# Patient Record
Sex: Female | Born: 1985 | Race: Black or African American | Hispanic: No | Marital: Single | Smoking: Never smoker
Health system: Southern US, Community
[De-identification: ages and names within clinical notes are randomized; demographics above are authoritative.]

## PROBLEM LIST (undated history)

## (undated) DIAGNOSIS — E119 Type 2 diabetes mellitus without complications: Secondary | ICD-10-CM

---

## 2006-05-04 ENCOUNTER — Emergency Department (HOSPITAL_COMMUNITY): Admission: EM | Admit: 2006-05-04 | Discharge: 2006-05-04 | Payer: Self-pay | Admitting: Emergency Medicine

## 2006-06-23 ENCOUNTER — Emergency Department (HOSPITAL_COMMUNITY): Admission: EM | Admit: 2006-06-23 | Discharge: 2006-06-23 | Payer: Self-pay | Admitting: Emergency Medicine

## 2007-12-17 ENCOUNTER — Emergency Department (HOSPITAL_COMMUNITY): Admission: EM | Admit: 2007-12-17 | Discharge: 2007-12-17 | Payer: Self-pay | Admitting: Emergency Medicine

## 2007-12-29 ENCOUNTER — Ambulatory Visit (HOSPITAL_COMMUNITY): Admission: RE | Admit: 2007-12-29 | Discharge: 2007-12-29 | Payer: Self-pay | Admitting: Obstetrics & Gynecology

## 2008-01-12 ENCOUNTER — Ambulatory Visit: Payer: Self-pay | Admitting: Obstetrics & Gynecology

## 2008-01-13 ENCOUNTER — Ambulatory Visit: Payer: Self-pay | Admitting: Obstetrics & Gynecology

## 2008-01-19 ENCOUNTER — Ambulatory Visit: Payer: Self-pay | Admitting: Obstetrics & Gynecology

## 2008-01-19 ENCOUNTER — Encounter: Admission: RE | Admit: 2008-01-19 | Discharge: 2008-04-05 | Payer: Self-pay | Admitting: Obstetrics & Gynecology

## 2008-01-26 ENCOUNTER — Ambulatory Visit: Payer: Self-pay | Admitting: Obstetrics & Gynecology

## 2008-01-26 ENCOUNTER — Ambulatory Visit (HOSPITAL_COMMUNITY): Admission: RE | Admit: 2008-01-26 | Discharge: 2008-01-26 | Payer: Self-pay | Admitting: Family Medicine

## 2008-02-12 ENCOUNTER — Ambulatory Visit: Payer: Self-pay | Admitting: *Deleted

## 2008-02-17 ENCOUNTER — Ambulatory Visit: Payer: Self-pay | Admitting: Obstetrics & Gynecology

## 2008-02-19 ENCOUNTER — Ambulatory Visit: Payer: Self-pay | Admitting: Obstetrics & Gynecology

## 2008-02-19 ENCOUNTER — Ambulatory Visit (HOSPITAL_COMMUNITY): Admission: RE | Admit: 2008-02-19 | Discharge: 2008-02-19 | Payer: Self-pay | Admitting: Obstetrics & Gynecology

## 2008-02-23 ENCOUNTER — Ambulatory Visit: Payer: Self-pay | Admitting: Obstetrics & Gynecology

## 2008-03-01 ENCOUNTER — Ambulatory Visit: Payer: Self-pay | Admitting: Obstetrics & Gynecology

## 2008-03-04 ENCOUNTER — Ambulatory Visit: Payer: Self-pay | Admitting: Obstetrics and Gynecology

## 2008-03-08 ENCOUNTER — Ambulatory Visit: Payer: Self-pay | Admitting: *Deleted

## 2008-03-11 ENCOUNTER — Ambulatory Visit: Payer: Self-pay | Admitting: Obstetrics and Gynecology

## 2008-03-11 ENCOUNTER — Ambulatory Visit (HOSPITAL_COMMUNITY): Admission: RE | Admit: 2008-03-11 | Discharge: 2008-03-11 | Payer: Self-pay | Admitting: Family Medicine

## 2008-03-15 ENCOUNTER — Ambulatory Visit: Payer: Self-pay | Admitting: Obstetrics & Gynecology

## 2008-03-18 ENCOUNTER — Ambulatory Visit: Payer: Self-pay | Admitting: Obstetrics & Gynecology

## 2008-03-18 ENCOUNTER — Ambulatory Visit (HOSPITAL_COMMUNITY): Admission: RE | Admit: 2008-03-18 | Discharge: 2008-03-18 | Payer: Self-pay | Admitting: Family Medicine

## 2008-03-25 ENCOUNTER — Ambulatory Visit: Payer: Self-pay | Admitting: Obstetrics and Gynecology

## 2008-03-29 ENCOUNTER — Ambulatory Visit: Payer: Self-pay | Admitting: Family Medicine

## 2008-04-01 ENCOUNTER — Ambulatory Visit: Payer: Self-pay | Admitting: Obstetrics & Gynecology

## 2008-04-03 ENCOUNTER — Inpatient Hospital Stay (HOSPITAL_COMMUNITY): Admission: AD | Admit: 2008-04-03 | Discharge: 2008-04-03 | Payer: Self-pay | Admitting: Obstetrics & Gynecology

## 2008-04-08 ENCOUNTER — Ambulatory Visit (HOSPITAL_COMMUNITY): Admission: RE | Admit: 2008-04-08 | Discharge: 2008-04-08 | Payer: Self-pay | Admitting: Obstetrics & Gynecology

## 2008-04-08 ENCOUNTER — Ambulatory Visit: Payer: Self-pay | Admitting: *Deleted

## 2008-04-12 ENCOUNTER — Ambulatory Visit: Payer: Self-pay | Admitting: Family Medicine

## 2008-04-14 ENCOUNTER — Ambulatory Visit: Payer: Self-pay | Admitting: Obstetrics and Gynecology

## 2008-04-14 ENCOUNTER — Inpatient Hospital Stay (HOSPITAL_COMMUNITY): Admission: AD | Admit: 2008-04-14 | Discharge: 2008-04-14 | Payer: Self-pay | Admitting: Obstetrics & Gynecology

## 2008-04-15 ENCOUNTER — Ambulatory Visit: Payer: Self-pay | Admitting: Obstetrics & Gynecology

## 2008-04-16 ENCOUNTER — Ambulatory Visit: Payer: Self-pay | Admitting: Gynecology

## 2008-04-16 ENCOUNTER — Inpatient Hospital Stay (HOSPITAL_COMMUNITY): Admission: AD | Admit: 2008-04-16 | Discharge: 2008-04-17 | Payer: Self-pay | Admitting: Gynecology

## 2008-06-26 IMAGING — US US OB FOLLOW-UP
1 series · 14 of 26 positions shown · non-contrast
Comparison: none

OBSTETRICAL ULTRASOUND:
 This ultrasound exam was performed in the [HOSPITAL] Ultrasound Department.  The OB US report was generated in the AS system, and faxed to the ordering physician.  This report is also available in [REDACTED] PACS.

[Series 1: us ob follow-up · 0.28mm/px · 14 of 26 slices shown]
[im 1/26]
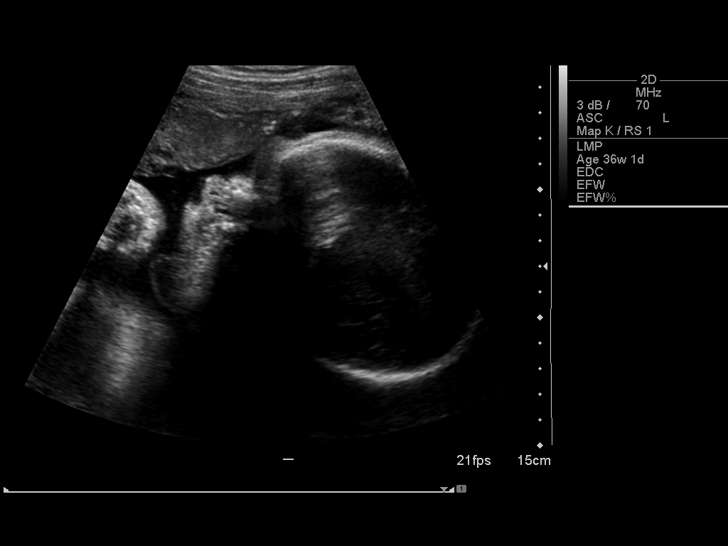
[im 3/26]
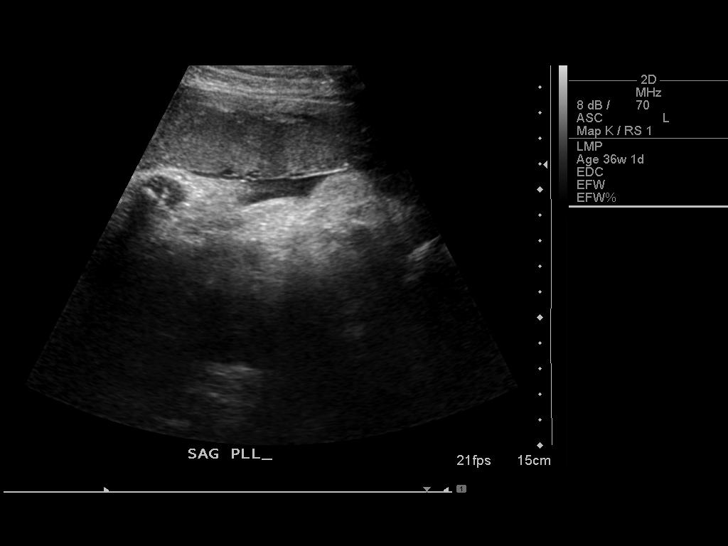
[im 5/26]
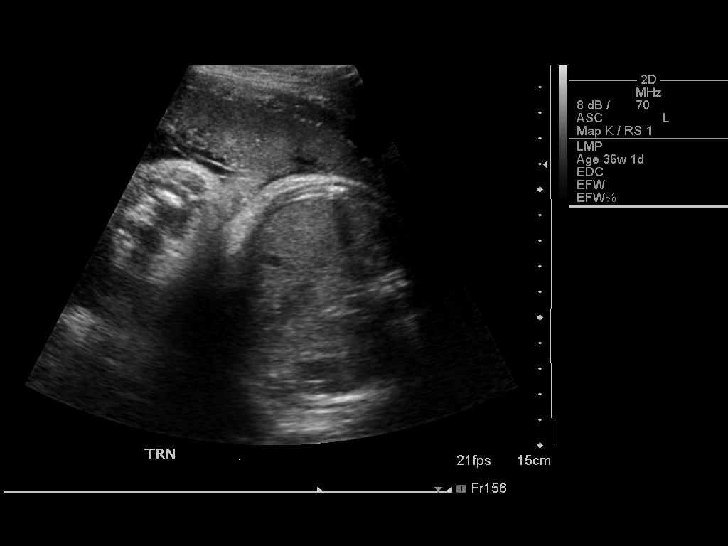
[im 7/26]
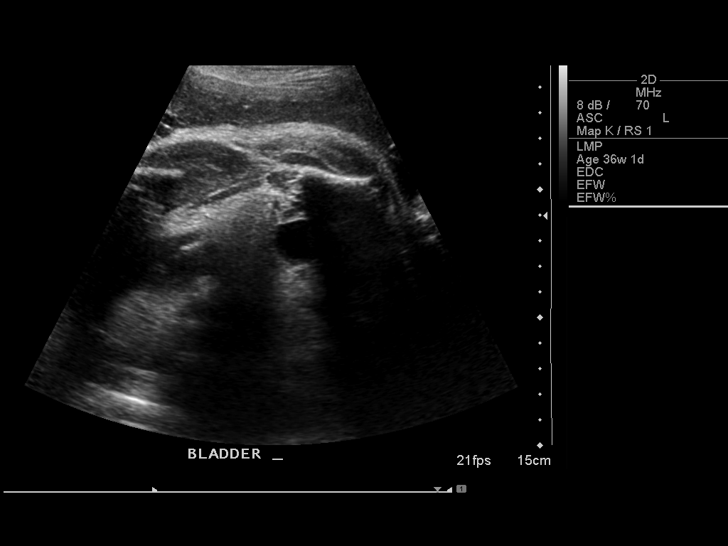
[im 9/26]
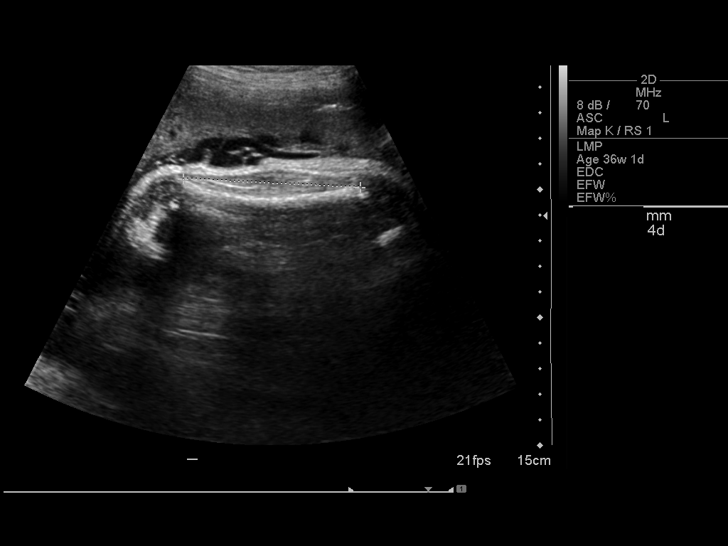
[im 11/26]
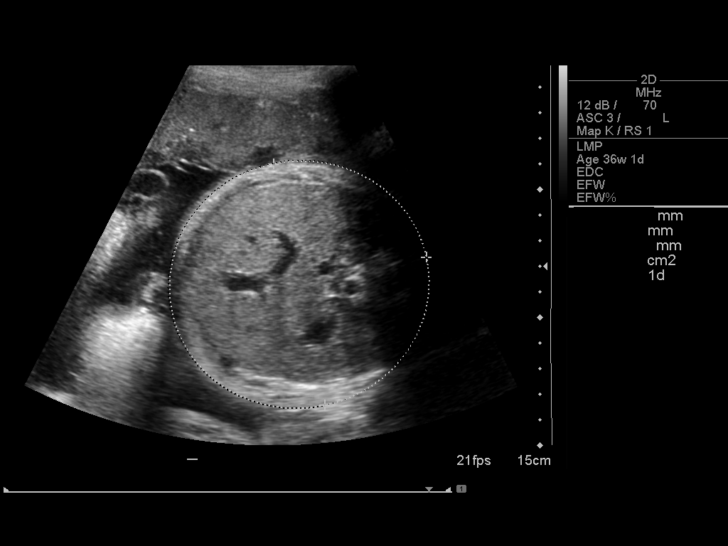
[im 13/26]
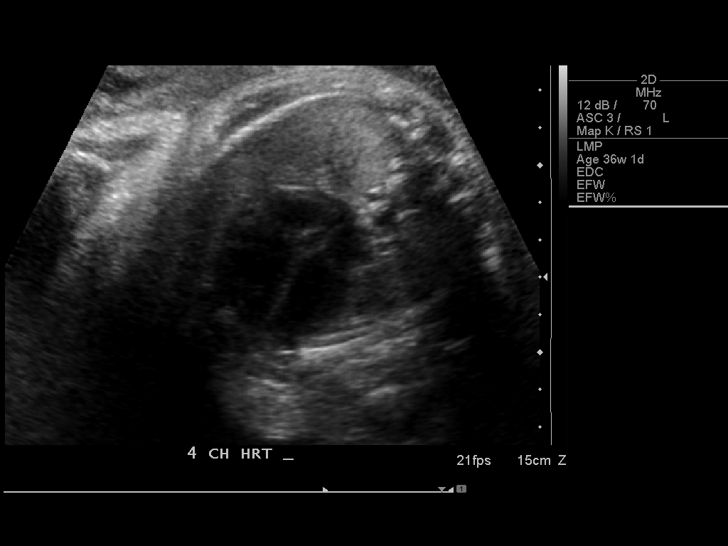
[im 14/26]
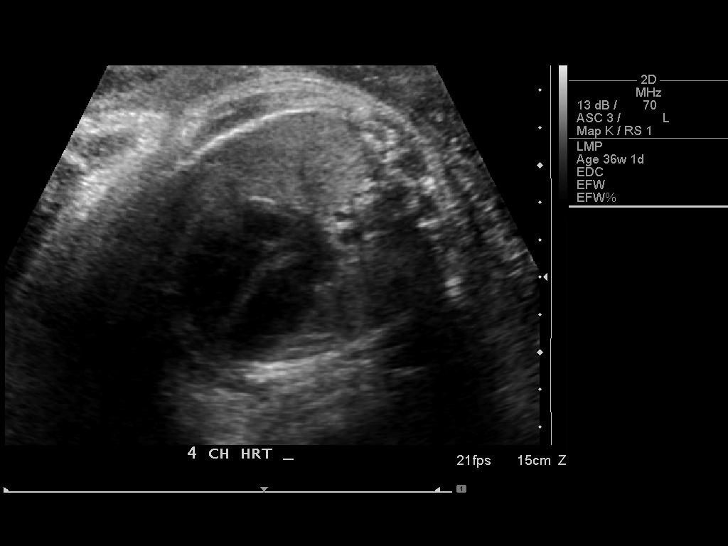
[im 16/26]
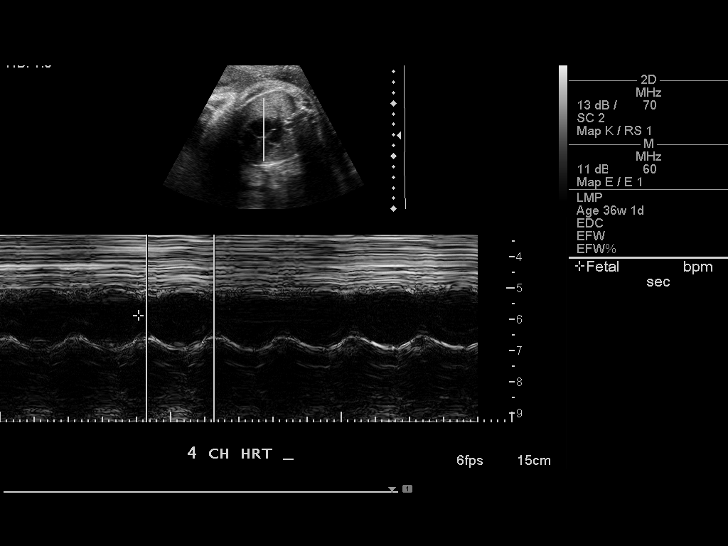
[im 18/26]
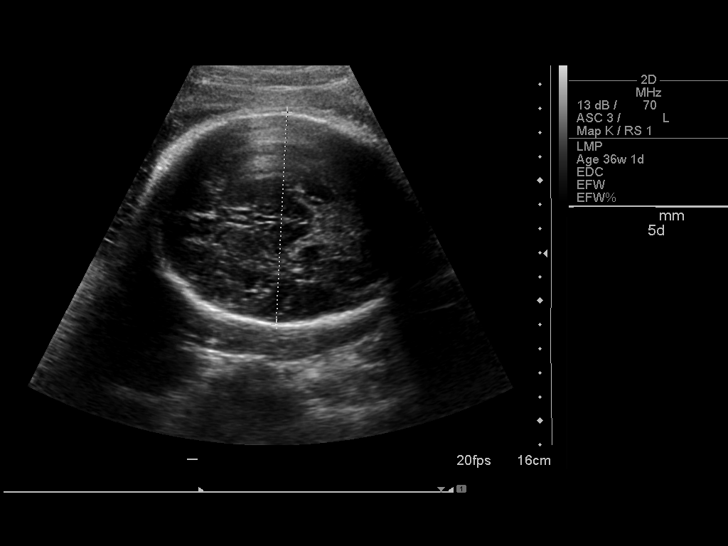
[im 20/26]
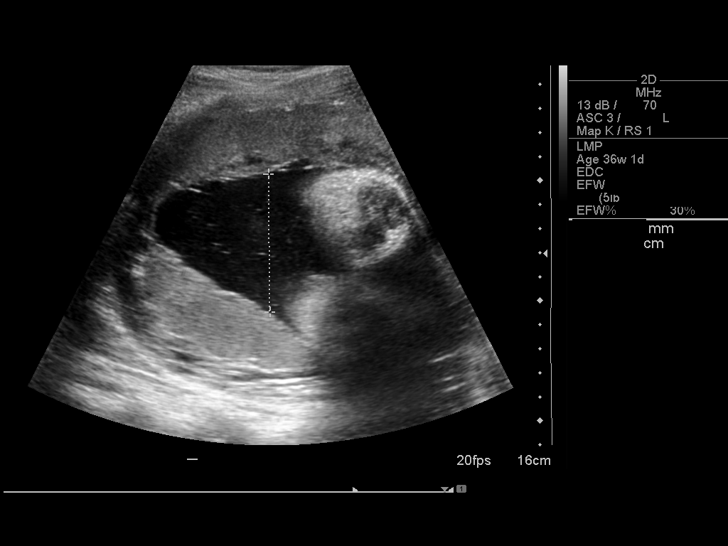
[im 22/26]
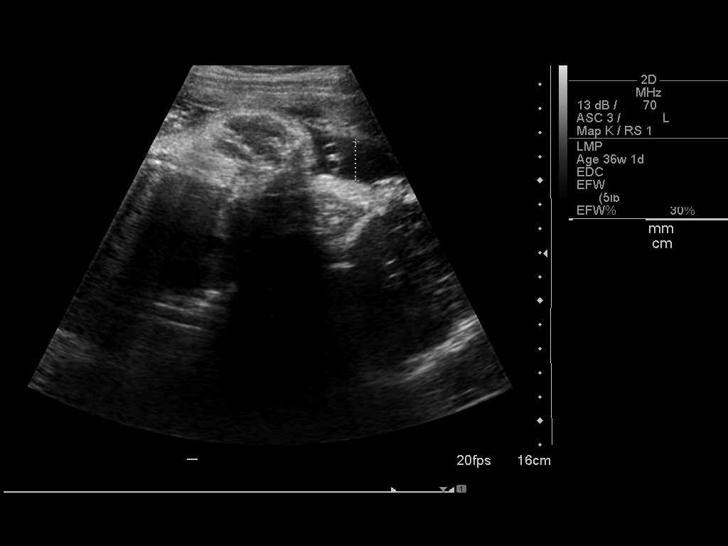
[im 24/26]
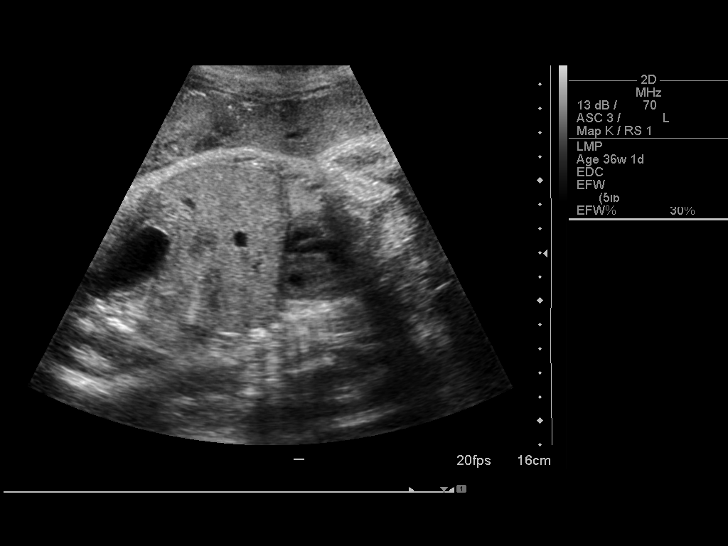
[im 26/26]
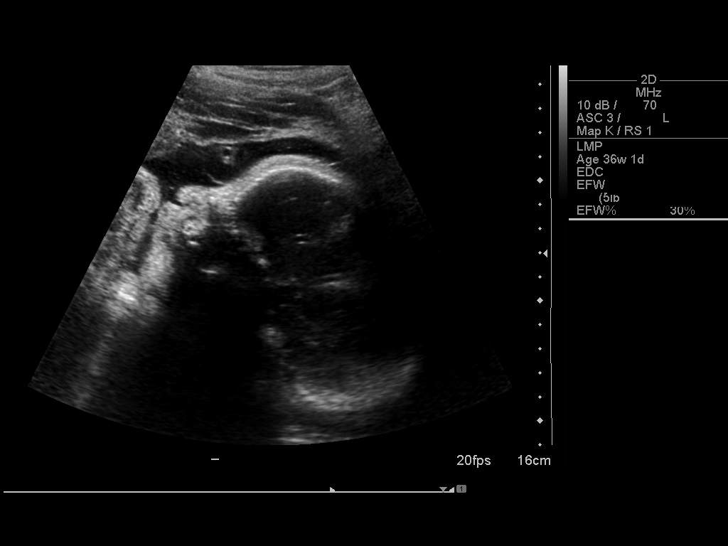

[14 of 26 positions shown; findings below may reference images not displayed]

IMPRESSION: See AS Obstetric US report.

## 2008-10-18 ENCOUNTER — Emergency Department (HOSPITAL_COMMUNITY): Admission: EM | Admit: 2008-10-18 | Discharge: 2008-10-18 | Payer: Self-pay | Admitting: Emergency Medicine

## 2011-06-18 LAB — URINALYSIS, ROUTINE W REFLEX MICROSCOPIC
Glucose, UA: NEGATIVE
Hgb urine dipstick: NEGATIVE
Protein, ur: NEGATIVE
Specific Gravity, Urine: 1.01
pH: 7.5

## 2011-06-18 LAB — URINE MICROSCOPIC-ADD ON

## 2011-06-19 LAB — POCT URINALYSIS DIP (DEVICE)
Ketones, ur: NEGATIVE
Ketones, ur: NEGATIVE
Protein, ur: 30 — AB
Protein, ur: NEGATIVE
Urobilinogen, UA: 0.2
Urobilinogen, UA: 0.2
pH: 6
pH: 6.5

## 2011-06-20 LAB — POCT URINALYSIS DIP (DEVICE)
Ketones, ur: NEGATIVE
Ketones, ur: NEGATIVE
Protein, ur: 30 — AB
Protein, ur: NEGATIVE
Specific Gravity, Urine: 1.015
Specific Gravity, Urine: 1.02
pH: 6.5
pH: 7

## 2011-06-21 LAB — POCT URINALYSIS DIP (DEVICE)
Bilirubin Urine: NEGATIVE
Hgb urine dipstick: NEGATIVE
Ketones, ur: NEGATIVE
Ketones, ur: NEGATIVE
Ketones, ur: NEGATIVE
Ketones, ur: NEGATIVE
Ketones, ur: NEGATIVE
Operator id: 148111
Operator id: 194561
Operator id: 194561
Protein, ur: 30 — AB
Protein, ur: NEGATIVE
Protein, ur: NEGATIVE
Protein, ur: NEGATIVE
Protein, ur: 30 — AB
Specific Gravity, Urine: 1.015
Specific Gravity, Urine: 1.015
Specific Gravity, Urine: 1.015
Specific Gravity, Urine: 1.015
Specific Gravity, Urine: 1.02
pH: 6.5
pH: 7
pH: 7

## 2011-06-21 LAB — CBC
HCT: 36.5
Hemoglobin: 12.1
MCV: 87
Platelets: 152
RBC: 4.19
WBC: 10.5

## 2011-06-21 LAB — DIFFERENTIAL
Eosinophils Absolute: 0.1
Eosinophils Relative: 1
Lymphs Abs: 1.5
Monocytes Absolute: 1
Monocytes Relative: 9

## 2011-06-22 LAB — POCT URINALYSIS DIP (DEVICE)
Hgb urine dipstick: NEGATIVE
Hgb urine dipstick: NEGATIVE
Ketones, ur: NEGATIVE
Ketones, ur: NEGATIVE
Protein, ur: NEGATIVE
Protein, ur: NEGATIVE
Specific Gravity, Urine: 1.01
Specific Gravity, Urine: 1.015
Urobilinogen, UA: 0.2
Urobilinogen, UA: 0.2

## 2011-06-22 LAB — CBC
HCT: 37.4
Hemoglobin: 10.9 — ABNORMAL LOW
MCHC: 32.8
MCHC: 33.5
MCV: 86.1
MCV: 87.6
Platelets: 188
RBC: 4.34
RDW: 13.6

## 2011-06-22 LAB — RPR: RPR Ser Ql: NONREACTIVE

## 2014-04-08 ENCOUNTER — Encounter (HOSPITAL_COMMUNITY): Payer: Self-pay | Admitting: Emergency Medicine

## 2014-04-08 ENCOUNTER — Emergency Department (HOSPITAL_COMMUNITY)
Admission: EM | Admit: 2014-04-08 | Discharge: 2014-04-08 | Disposition: A | Discharge status: Home / Self Care | Payer: No Typology Code available for payment source | Attending: Emergency Medicine | Admitting: Emergency Medicine

## 2014-04-08 ENCOUNTER — Emergency Department (HOSPITAL_COMMUNITY): Payer: No Typology Code available for payment source

## 2014-04-08 DIAGNOSIS — X58XXXA Exposure to other specified factors, initial encounter: Secondary | ICD-10-CM | POA: Insufficient documentation

## 2014-04-08 DIAGNOSIS — IMO0002 Reserved for concepts with insufficient information to code with codable children: Secondary | ICD-10-CM | POA: Insufficient documentation

## 2014-04-08 DIAGNOSIS — S76311A Strain of muscle, fascia and tendon of the posterior muscle group at thigh level, right thigh, initial encounter: Secondary | ICD-10-CM

## 2014-04-08 DIAGNOSIS — Z91040 Latex allergy status: Secondary | ICD-10-CM | POA: Insufficient documentation

## 2014-04-08 DIAGNOSIS — Y929 Unspecified place or not applicable: Secondary | ICD-10-CM | POA: Insufficient documentation

## 2014-04-08 DIAGNOSIS — E119 Type 2 diabetes mellitus without complications: Secondary | ICD-10-CM | POA: Insufficient documentation

## 2014-04-08 DIAGNOSIS — Y939 Activity, unspecified: Secondary | ICD-10-CM | POA: Insufficient documentation

## 2014-04-08 DIAGNOSIS — Z880 Allergy status to penicillin: Secondary | ICD-10-CM | POA: Insufficient documentation

## 2014-04-08 HISTORY — DX: Type 2 diabetes mellitus without complications: E11.9

## 2014-04-08 MED ORDER — IBUPROFEN 800 MG PO TABS: 800.0000 mg | ORAL_TABLET | Freq: Three times a day (TID) | ORAL | Status: AC | PRN

## 2014-04-08 NOTE — ED Provider Notes (Signed)
CSN: [id number]     Arrival date & time 04/08/14  2032 History  This chart was scribed for Charlestine Nighthristopher Johnnye Sandford, PA-C working with  Shon Batonourtney F Horton, MD by Evon Slackerrance Branch, ED Scribe. This patient was seen in room  and the patient's care was started at 9:13 PM.     Chief Complaint  Patient presents with  . Knee Pain   The history is provided by the patient. No language interpreter was used.   HPI Comments: Mackenzie Richmond is a 28 y.o. female who presents to the Emergency Department complaining of right leg pain. She states she feel like the pain is in her hamstrings. She states th pain is around her knee. She states that when the leg is extended her pain worsens. She denies any other symptoms.    Past Medical History  Diagnosis Date  . Diabetes mellitus without complication    No past surgical history on file. No family history on file. History  Substance Use Topics  . Smoking status: Not on file  . Smokeless tobacco: Not on file  . Alcohol Use: Not on file   OB History   Grav Para Term Preterm Abortions TAB SAB Ect Mult Living                 Review of Systems  Musculoskeletal: Positive for myalgias.   A complete 10 system review of systems was obtained and all systems are negative except as noted in the HPI and PMH.   Allergies  Latex and Penicillins  Home Medications   Prior to Admission medications   Not on File   Triage Vitals: BP 136/97  Pulse 97  Temp(Src) 98.1 F (36.7 C) (Oral)  Resp 18  Wt 122 lb 2 oz (55.396 kg)  SpO2 100%  Physical Exam  Nursing note and vitals reviewed. Constitutional: She is oriented to person, place, and time. She appears well-developed and well-nourished. No distress.  HENT:  Head: Normocephalic and atraumatic.  Eyes: Conjunctivae and EOM are normal.  Neck: Neck supple. No tracheal deviation present.  Cardiovascular: Normal rate.   Pulmonary/Chest: Effort normal. No respiratory distress.  Musculoskeletal: Normal range of  motion.  Neurological: She is alert and oriented to person, place, and time.  Skin: Skin is warm and dry.  Psychiatric: She has a normal mood and affect. Her behavior is normal.    ED Course  Procedures (including critical care time) DIAGNOSTIC STUDIES: Oxygen Saturation is 100% on RA, normal by my interpretation.    COORDINATION OF CARE:    Imaging Review Dg Knee Complete 4 Views Right  04/08/2014   CLINICAL DATA:  Knee pain.  EXAM: RIGHT KNEE - COMPLETE 4+ VIEW  COMPARISON:  None.  FINDINGS: There is no evidence of fracture, dislocation, or joint effusion. There is no evidence of arthropathy or other focal bone abnormality. Soft tissues are unremarkable.  IMPRESSION: Negative.   Electronically Signed   By: Elberta Fortisaniel  Boyle M.D.   On: 04/08/2014 21:52    Patient has pain over the medial  head of the hamstring muscle.  Patient also has pain over the lateral and mid head of the hamstring  Carlyle DollyChristopher W Renald Haithcock, PA-C 04/08/14 2225

## 2014-04-08 NOTE — Discharge Instructions (Signed)
Return here as needed.  Followup with her primary care Dr. use ice and heat on your leg

## 2014-04-08 NOTE — ED Notes (Addendum)
Pt reports right knee pain. Taken aleve, heat, cold therapy. States it is worse first thing in morning. Pain been going on for week. No swelling noted. Denies known injury. Pt denies physical activity in months.

## 2014-04-08 NOTE — ED Notes (Signed)
Declined W/C at D/C and was escorted to lobby by RN. 

## 2014-04-09 NOTE — ED Provider Notes (Signed)
Medical screening examination/treatment/procedure(s) were performed by non-physician practitioner and as supervising physician I was immediately available for consultation/collaboration.   EKG Interpretation None        Renee Erb F Laquitha Heslin, MD 04/09/14 0834 

## 2015-10-26 ENCOUNTER — Encounter (HOSPITAL_COMMUNITY): Payer: Self-pay | Admitting: Emergency Medicine

## 2015-10-26 ENCOUNTER — Emergency Department (HOSPITAL_COMMUNITY)
Admission: EM | Admit: 2015-10-26 | Discharge: 2015-10-26 | Disposition: A | Discharge status: Home / Self Care | Payer: No Typology Code available for payment source | Attending: Emergency Medicine | Admitting: Emergency Medicine

## 2015-10-26 DIAGNOSIS — S0181XA Laceration without foreign body of other part of head, initial encounter: Secondary | ICD-10-CM | POA: Insufficient documentation

## 2015-10-26 DIAGNOSIS — E119 Type 2 diabetes mellitus without complications: Secondary | ICD-10-CM | POA: Insufficient documentation

## 2015-10-26 DIAGNOSIS — Z91040 Latex allergy status: Secondary | ICD-10-CM | POA: Insufficient documentation

## 2015-10-26 DIAGNOSIS — W1839XA Other fall on same level, initial encounter: Secondary | ICD-10-CM | POA: Insufficient documentation

## 2015-10-26 DIAGNOSIS — Y9289 Other specified places as the place of occurrence of the external cause: Secondary | ICD-10-CM | POA: Insufficient documentation

## 2015-10-26 DIAGNOSIS — Y9301 Activity, walking, marching and hiking: Secondary | ICD-10-CM | POA: Insufficient documentation

## 2015-10-26 DIAGNOSIS — Z880 Allergy status to penicillin: Secondary | ICD-10-CM | POA: Insufficient documentation

## 2015-10-26 DIAGNOSIS — Y998 Other external cause status: Secondary | ICD-10-CM | POA: Insufficient documentation

## 2015-10-26 LAB — CBG MONITORING, ED: GLUCOSE-CAPILLARY: 83 mg/dL (ref 65–99)

## 2015-10-26 MED ORDER — HYDROCODONE-ACETAMINOPHEN 5-325 MG PO TABS: 1.0000 | ORAL_TABLET | Freq: Four times a day (QID) | ORAL | Status: AC | PRN

## 2015-10-26 MED ORDER — LIDOCAINE-EPINEPHRINE (PF) 2 %-1:200000 IJ SOLN
20.0000 mL | Freq: Once | INTRAMUSCULAR | Status: AC
  Administered 2015-10-26: 20 mL via INTRADERMAL
  Filled 2015-10-26: qty 20

## 2015-10-26 NOTE — ED Notes (Signed)
Patient states mechanical fall where she fell and hit her chin on the floor.   Patient denies syncope.   Patient states she was very tired.

## 2015-10-26 NOTE — Discharge Instructions (Signed)
Please follow up with your doctor or Urgent Care in 5 days for sutures removal.  Return if you see signs of infection. Take pain medication as needed but avoid driving or operate heavy machinery.   Facial Laceration  A facial laceration is a cut on the face. These injuries can be painful and cause bleeding. Lacerations usually heal quickly, but they need special care to reduce scarring. DIAGNOSIS  Your health care provider will take a medical history, ask for details about how the injury occurred, and examine the wound to determine how deep the cut is. TREATMENT  Some facial lacerations may not require closure. Others may not be able to be closed because of an increased risk of infection. The risk of infection and the chance for successful closure will depend on various factors, including the amount of time since the injury occurred. The wound may be cleaned to help prevent infection. If closure is appropriate, pain medicines may be given if needed. Your health care provider will use stitches (sutures), wound glue (adhesive), or skin adhesive strips to repair the laceration. These tools bring the skin edges together to allow for faster healing and a better cosmetic outcome. If needed, you may also be given a tetanus shot. HOME CARE INSTRUCTIONS  Only take over-the-counter or prescription medicines as directed by your health care provider.  Follow your health care provider's instructions for wound care. These instructions will vary depending on the technique used for closing the wound. For Sutures:  Keep the wound clean and dry.   If you were given a bandage (dressing), you should change it at least once a day. Also change the dressing if it becomes wet or dirty, or as directed by your health care provider.   Wash the wound with soap and water 2 times a day. Rinse the wound off with water to remove all soap. Pat the wound dry with a clean towel.   After cleaning, apply a thin layer of the  antibiotic ointment recommended by your health care provider. This will help prevent infection and keep the dressing from sticking.   You may shower as usual after the first 24 hours. Do not soak the wound in water until the sutures are removed.   Get your sutures removed as directed by your health care provider. With facial lacerations, sutures should usually be taken out after 4-5 days to avoid stitch marks.   Wait a few days after your sutures are removed before applying any makeup. For Skin Adhesive Strips:  Keep the wound clean and dry.   Do not get the skin adhesive strips wet. You may bathe carefully, using caution to keep the wound dry.   If the wound gets wet, pat it dry with a clean towel.   Skin adhesive strips will fall off on their own. You may trim the strips as the wound heals. Do not remove skin adhesive strips that are still stuck to the wound. They will fall off in time.  For Wound Adhesive:  You may briefly wet your wound in the shower or bath. Do not soak or scrub the wound. Do not swim. Avoid periods of heavy sweating until the skin adhesive has fallen off on its own. After showering or bathing, gently pat the wound dry with a clean towel.   Do not apply liquid medicine, cream medicine, ointment medicine, or makeup to your wound while the skin adhesive is in place. This may loosen the film before your wound is healed.  If a dressing is placed over the wound, be careful not to apply tape directly over the skin adhesive. This may cause the adhesive to be pulled off before the wound is healed.   Avoid prolonged exposure to sunlight or tanning lamps while the skin adhesive is in place.  The skin adhesive will usually remain in place for 5-10 days, then naturally fall off the skin. Do not pick at the adhesive film.  After Healing: Once the wound has healed, cover the wound with sunscreen during the day for 1 full year. This can help minimize scarring. Exposure  to ultraviolet light in the first year will darken the scar. It can take 1-2 years for the scar to lose its redness and to heal completely.  SEEK MEDICAL CARE IF:  You have a fever. SEEK IMMEDIATE MEDICAL CARE IF:  You have redness, pain, or swelling around the wound.   You see ayellowish-white fluid (pus) coming from the wound.    This information is not intended to replace advice given to you by your health care provider. Make sure you discuss any questions you have with your health care provider.   Document Released: 10/18/2004 Document Revised: 10/01/2014 Document Reviewed: 04/23/2013 Elsevier Interactive Patient Education [employer]

## 2015-10-26 NOTE — ED Provider Notes (Signed)
CSN: [id number]     Arrival date & time 10/26/15  1257 History  By signing my name below, I, Freida Busman, attest that this documentation has been prepared under the direction and in the presence of non-physician practitioner, Fayrene Helper, PA-C. Electronically Signed: Freida Busman, Scribe. 10/26/2015. 1:13 PM.    Chief Complaint  Patient presents with  . Fall   The history is provided by the patient. No language interpreter was used.     HPI Comments:  Mackenzie Richmond is a 30 y.o. female who presents to the Emergency Department s/p fall ~1 hour PTA complaining of a laceration to her chin following the incident with associated 7/10 pain to her chin and jaw. She states she was walking at home and "just fell". She denies tripping but notes she was tired at the time. She denies dizziness/lightheadedness prior to fall, use of illicit drugs/ETOH and h/o frequent falls. Pt has no other injuries or complaints at this time. No alleviating factors noted. Tetanus is UTD within the last 5 years.    Past Medical History  Diagnosis Date  . Diabetes mellitus without complication (HCC)    History reviewed. No pertinent past surgical history. No family history on file. Social History  Substance Use Topics  . Smoking status: Never Smoker   . Smokeless tobacco: Never Used  . Alcohol Use: No   OB History    No data available     Review of Systems  HENT:       + Jaw Pain   Skin: Positive for wound.  Neurological: Negative for dizziness, weakness and light-headedness.   Allergies  Latex and Penicillins  Home Medications   Prior to Admission medications   Medication Sig Start Date End Date Taking? Authorizing Provider  ibuprofen (ADVIL,MOTRIN) 800 MG tablet Take 1 tablet (800 mg total) by mouth every 8 (eight) hours as needed. 04/08/14   Charlestine Night, PA-C   BP 115/74 mmHg  Pulse 119  Resp 20  SpO2 100%  LMP 10/13/2015 Physical Exam  Constitutional: She is oriented to person,  place, and time. She appears well-developed and well-nourished. No distress.  HENT:  Head: Normocephalic and atraumatic.  Eyes: Conjunctivae are normal.  Cardiovascular: Normal rate.   Pulmonary/Chest: Effort normal.  Abdominal: She exhibits no distension.  Neurological: She is alert and oriented to person, place, and time.  Skin: Skin is warm and dry.  4 cm deep laceration noted to anterior chin; not actively bleeding. No foreign body. There is TTP.  Psychiatric: She has a normal mood and affect.  Nursing note and vitals reviewed.   ED Course  Procedures   DIAGNOSTIC STUDIES:  Oxygen Saturation is 100% on RA, normal by my interpretation.    COORDINATION OF CARE:  1:13 PM Will repair laceration. Discussed treatment plan with pt at bedside and pt agreed to plan.  LACERATION REPAIR PROCEDURE NOTE The patient's identification was confirmed and consent was obtained. This procedure was performed by Fayrene Helper, PA-C at 1:30 PM. Site: anterior chin Sterile procedures observed Anesthetic used: lido with epi Suture type/size: 5-0 prolene Length: 4 cm # of Sutures: 8 Technique: simple interrupted  Complexity: Complex Antibx ointment applied Tetanus UTD  Site anesthetized, irrigated with NS, explored without evidence of foreign body, wound well approximated, site covered with dry, sterile dressing.  Patient tolerated procedure well without complications. Instructions for care discussed verbally and patient provided with additional written instructions for homecare and f/u.   MDM   Final diagnoses:  Chin laceration, initial encounter    BP 118/78 mmHg  Pulse 82  Resp 20  SpO2 100%  LMP 10/13/2015  Tetanus UTD. Laceration occurred < 12 hours prior to repair. Discussed laceration care with pt and answered questions. Pt to f-u for suture removal in 5 days and wound check sooner should there be signs of dehiscence or infection. Pt is hemodynamically stable with no complaints  prior to dc.  Normal CBG of 83  I personally performed the services described in this documentation, which was scribed in my presence. The recorded information has been reviewed and is accurate.      Fayrene Helper, PA-C 10/26/15 1422  Vanetta Mulders, MD 10/28/15 2230

## 2015-10-26 NOTE — ED Notes (Signed)
See PA assessment 

## 2015-10-31 ENCOUNTER — Emergency Department (INDEPENDENT_AMBULATORY_CARE_PROVIDER_SITE_OTHER)
Admission: EM | Admit: 2015-10-31 | Discharge: 2015-10-31 | Disposition: A | Discharge status: Home / Self Care | Payer: Self-pay | Source: Home / Self Care | Attending: Emergency Medicine | Admitting: Emergency Medicine

## 2015-10-31 ENCOUNTER — Encounter (HOSPITAL_COMMUNITY): Payer: Self-pay | Admitting: Emergency Medicine

## 2015-10-31 DIAGNOSIS — Z4802 Encounter for removal of sutures: Secondary | ICD-10-CM

## 2015-10-31 NOTE — Discharge Instructions (Signed)

## 2015-10-31 NOTE — ED Notes (Signed)
Suture removal from chin.  Seen in emergency department on 2/1

## 2015-10-31 NOTE — ED Provider Notes (Signed)
CSN: [id number]     Arrival date & time 10/31/15  1517 History   First MD Initiated Contact with Patient 10/31/15 1651     Chief Complaint  Patient presents with  . Suture / Staple Removal   (Consider location/radiation/quality/duration/timing/severity/associated sxs/prior Treatment) HPI Comments: This patient fell approximate 5 days ago and struck her chin on the ground. This produced a transverse laceration across the distal chin. She was seen in the emergency department where it was sutured. She is here today for suture removal. She has no complaints regarding the wound.   Past Medical History  Diagnosis Date  . Diabetes mellitus without complication (HCC)    History reviewed. No pertinent past surgical history. No family history on file. Social History  Substance Use Topics  . Smoking status: Never Smoker   . Smokeless tobacco: Never Used  . Alcohol Use: No   OB History    No data available     Review of Systems  Constitutional: Negative.  Negative for fever and activity change.  HENT: Negative.   Skin:       As per history of present illness  Neurological: Negative.   All other systems reviewed and are negative.   Allergies  Latex and Penicillins  Home Medications   Prior to Admission medications   Medication Sig Start Date End Date Taking? Authorizing Provider  HYDROcodone-acetaminophen (NORCO/VICODIN) 5-325 MG tablet Take 1 tablet by mouth every 6 (six) hours as needed for moderate pain. 10/26/15   Fayrene Helper, PA-C  ibuprofen (ADVIL,MOTRIN) 800 MG tablet Take 1 tablet (800 mg total) by mouth every 8 (eight) hours as needed. 04/08/14   Charlestine Night, PA-C   Meds Ordered and Administered this Visit  Medications - No data to display  BP 106/61 mmHg  Pulse 63  Temp(Src) 97.9 F (36.6 C) (Oral)  Resp 16  SpO2 100%  LMP 10/13/2015 No data found.   Physical Exam  Constitutional: She is oriented to person, place, and time. She appears well-developed and  well-nourished. No distress.  Neck: Normal range of motion. Neck supple.  Neurological: She is alert and oriented to person, place, and time. She exhibits normal muscle tone.  Skin: Skin is warm and dry.  The chin laceration wound edges are well approximated. No signs of infection. All sutures are intact. Healing well.  Nursing note and vitals reviewed.   ED Course  .Suture Removal Date/Time: 10/31/2015 5:35 PM Performed by: Phineas Real, Haille Pardi Authorized by: Charm Rings Consent: Verbal consent obtained. Risks and benefits: risks, benefits and alternatives were discussed Consent given by: patient Patient understanding: patient states understanding of the procedure being performed Patient identity confirmed: verbally with patient Body area: head/neck Location details: chin Wound Appearance: clean Sutures Removed: 8 Patient tolerance: Patient tolerated the procedure well with no immediate complications   (including critical care time)  Labs Review Labs Reviewed - No data to display  Imaging Review No results found.   Visual Acuity Review  Right Eye Distance:   Left Eye Distance:   Bilateral Distance:    Right Eye Near:   Left Eye Near:    Bilateral Near:         MDM   1. Visit for suture removal    Suture removal care info given.    Hayden Rasmussen, NP 10/31/15 1736

## 2021-02-09 ENCOUNTER — Other Ambulatory Visit: Payer: Self-pay

## 2021-02-09 ENCOUNTER — Encounter (HOSPITAL_COMMUNITY): Payer: Self-pay

## 2021-02-09 ENCOUNTER — Ambulatory Visit (HOSPITAL_COMMUNITY)
Admission: EM | Admit: 2021-02-09 | Discharge: 2021-02-09 | Disposition: A | Discharge status: Home / Self Care | Payer: Self-pay | Attending: Internal Medicine | Admitting: Internal Medicine

## 2021-02-09 ENCOUNTER — Ambulatory Visit (INDEPENDENT_AMBULATORY_CARE_PROVIDER_SITE_OTHER): Payer: Self-pay

## 2021-02-09 DIAGNOSIS — R2242 Localized swelling, mass and lump, left lower limb: Secondary | ICD-10-CM

## 2021-02-09 DIAGNOSIS — M79672 Pain in left foot: Secondary | ICD-10-CM

## 2021-02-09 DIAGNOSIS — S9032XA Contusion of left foot, initial encounter: Secondary | ICD-10-CM
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
## 2021-02-09 NOTE — Discharge Instructions (Addendum)
There is no evidence of a broken bone on your x-ray today.  Continue elevating and using ice as needed.  Tylenol 975mg  every 4 hours as needed for pain

## 2021-02-09 NOTE — ED Triage Notes (Signed)
Pt c/o left foot pain and swelling x 1 week. She states it hurts to apply pressure on her foot. She states she has noticed discoloration on her foot. She states there is an open gash on her left foot.
